# Patient Record
Sex: Female | Born: 1975 | Race: White | Hispanic: No | Smoking: Current every day smoker
Health system: Southern US, Community
[De-identification: ages and names within clinical notes are randomized; demographics above are authoritative.]

## PROBLEM LIST (undated history)

## (undated) DIAGNOSIS — G4700 Insomnia, unspecified: Secondary | ICD-10-CM

## (undated) DIAGNOSIS — R51 Headache: Secondary | ICD-10-CM

## (undated) DIAGNOSIS — I959 Hypotension, unspecified: Secondary | ICD-10-CM

## (undated) DIAGNOSIS — G4733 Obstructive sleep apnea (adult) (pediatric): Secondary | ICD-10-CM

## (undated) DIAGNOSIS — M542 Cervicalgia: Secondary | ICD-10-CM

## (undated) DIAGNOSIS — J45909 Unspecified asthma, uncomplicated: Secondary | ICD-10-CM

## (undated) DIAGNOSIS — R519 Headache, unspecified: Secondary | ICD-10-CM

## (undated) DIAGNOSIS — N941 Unspecified dyspareunia: Secondary | ICD-10-CM

## (undated) DIAGNOSIS — G43909 Migraine, unspecified, not intractable, without status migrainosus: Secondary | ICD-10-CM

## (undated) DIAGNOSIS — G8929 Other chronic pain: Secondary | ICD-10-CM

## (undated) HISTORY — PX: APPENDECTOMY: SHX54

## (undated) HISTORY — PX: KIDNEY SURGERY: SHX687

---

## 2010-02-02 ENCOUNTER — Encounter: Payer: Self-pay | Admitting: Internal Medicine

## 2010-02-12 ENCOUNTER — Encounter: Payer: Self-pay | Admitting: Internal Medicine

## 2010-03-14 ENCOUNTER — Encounter: Payer: Self-pay | Admitting: Internal Medicine

## 2012-02-03 ENCOUNTER — Ambulatory Visit: Payer: Self-pay | Admitting: Internal Medicine

## 2012-06-28 ENCOUNTER — Ambulatory Visit: Payer: Self-pay | Admitting: Internal Medicine

## 2012-06-28 LAB — URINALYSIS, COMPLETE
Bilirubin,UR: NEGATIVE
Glucose,UR: NEGATIVE mg/dL (ref 0–75)
Leukocyte Esterase: NEGATIVE
Nitrite: NEGATIVE
RBC,UR: NONE SEEN /HPF (ref 0–5)
Specific Gravity: 1.01 (ref 1.003–1.030)
Squamous Epithelial: >30

## 2012-06-30 LAB — URINE CULTURE

## 2012-11-18 ENCOUNTER — Ambulatory Visit: Payer: Self-pay | Admitting: Emergency Medicine

## 2013-01-03 IMAGING — CR DG THORACIC SPINE 2-3V
1 series · 2 of 2 positions shown · non-contrast
Comparison: none

REASON FOR EXAM: mid back pain
COMMENTS:

PROCEDURE:     KDR - KDXR THORACIC AP AND LATERAL  - February 03, 2012  [DATE]
RESULT:     Thoracic spine AP and lateral views demonstrate normal alignment
with preservation of the vertebral body heights. There is no bony
destruction or focal sclerotic mass.

[Series 1: ap · 0.17mm/px · 2 of 2 slices shown]
[im 1/2]
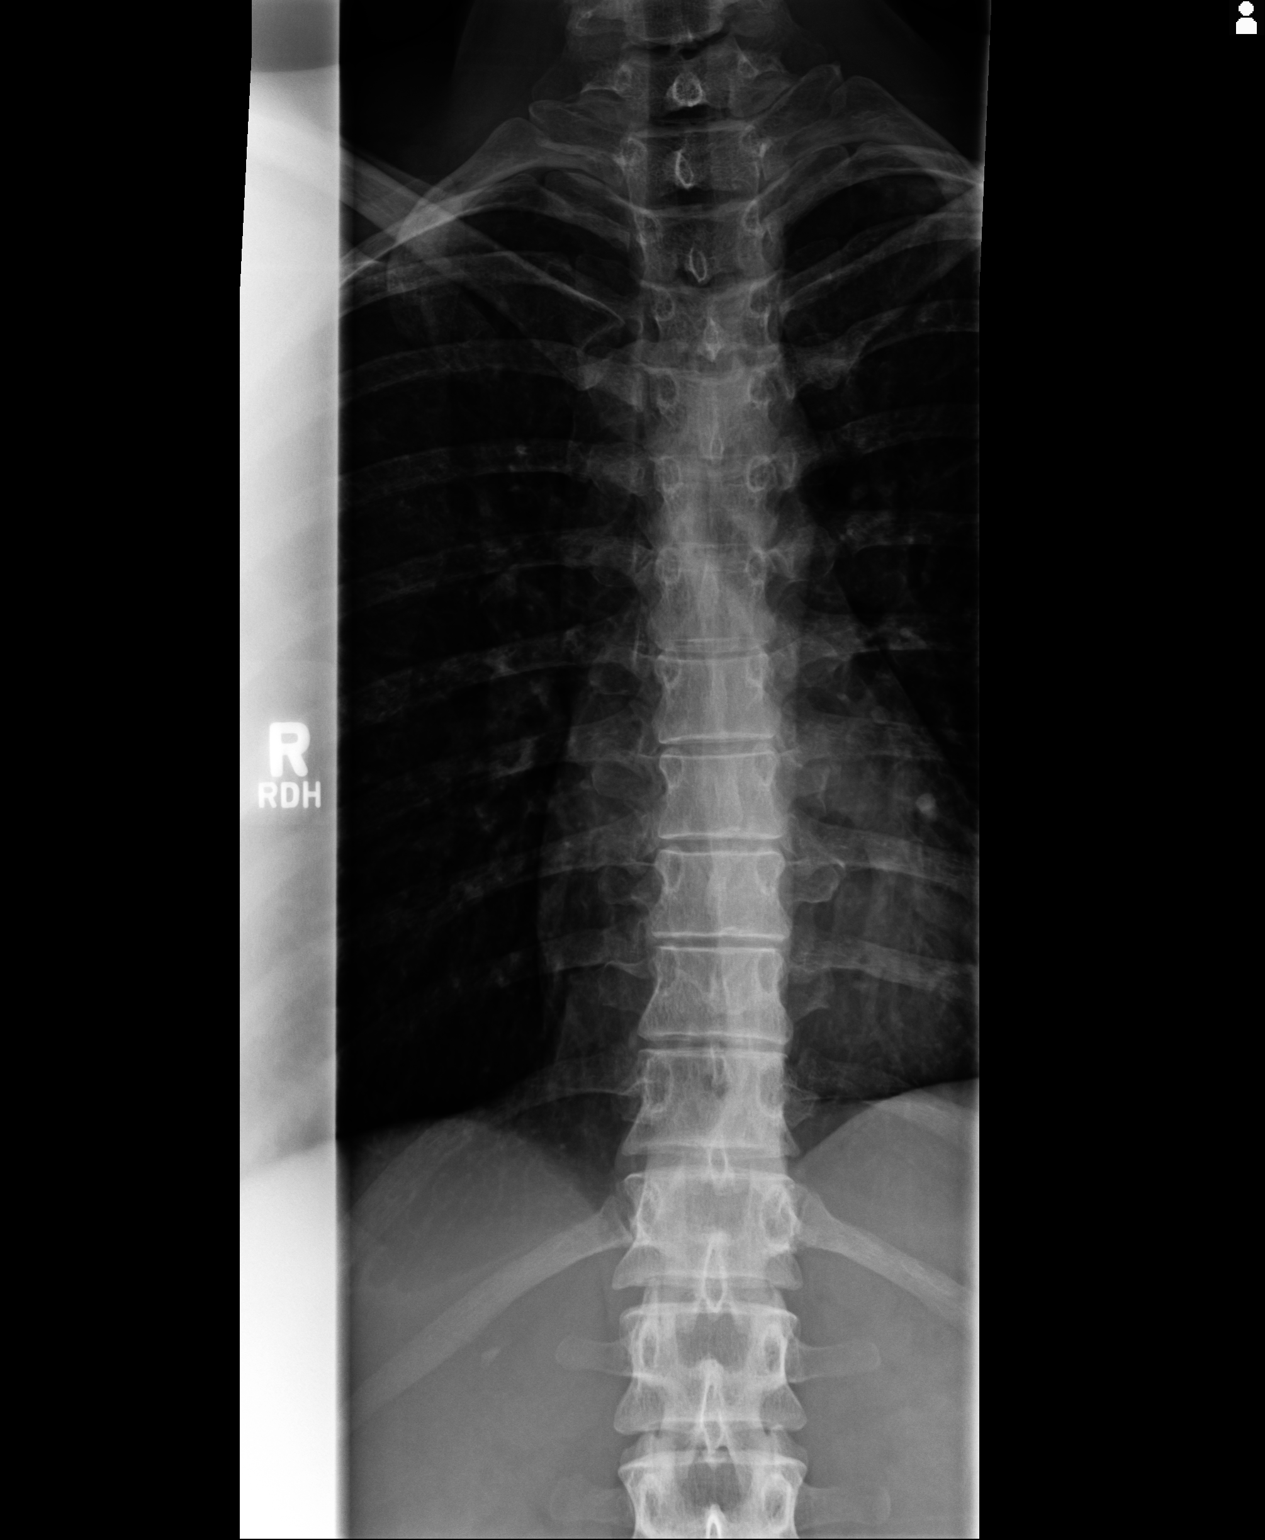
[im 2/2]
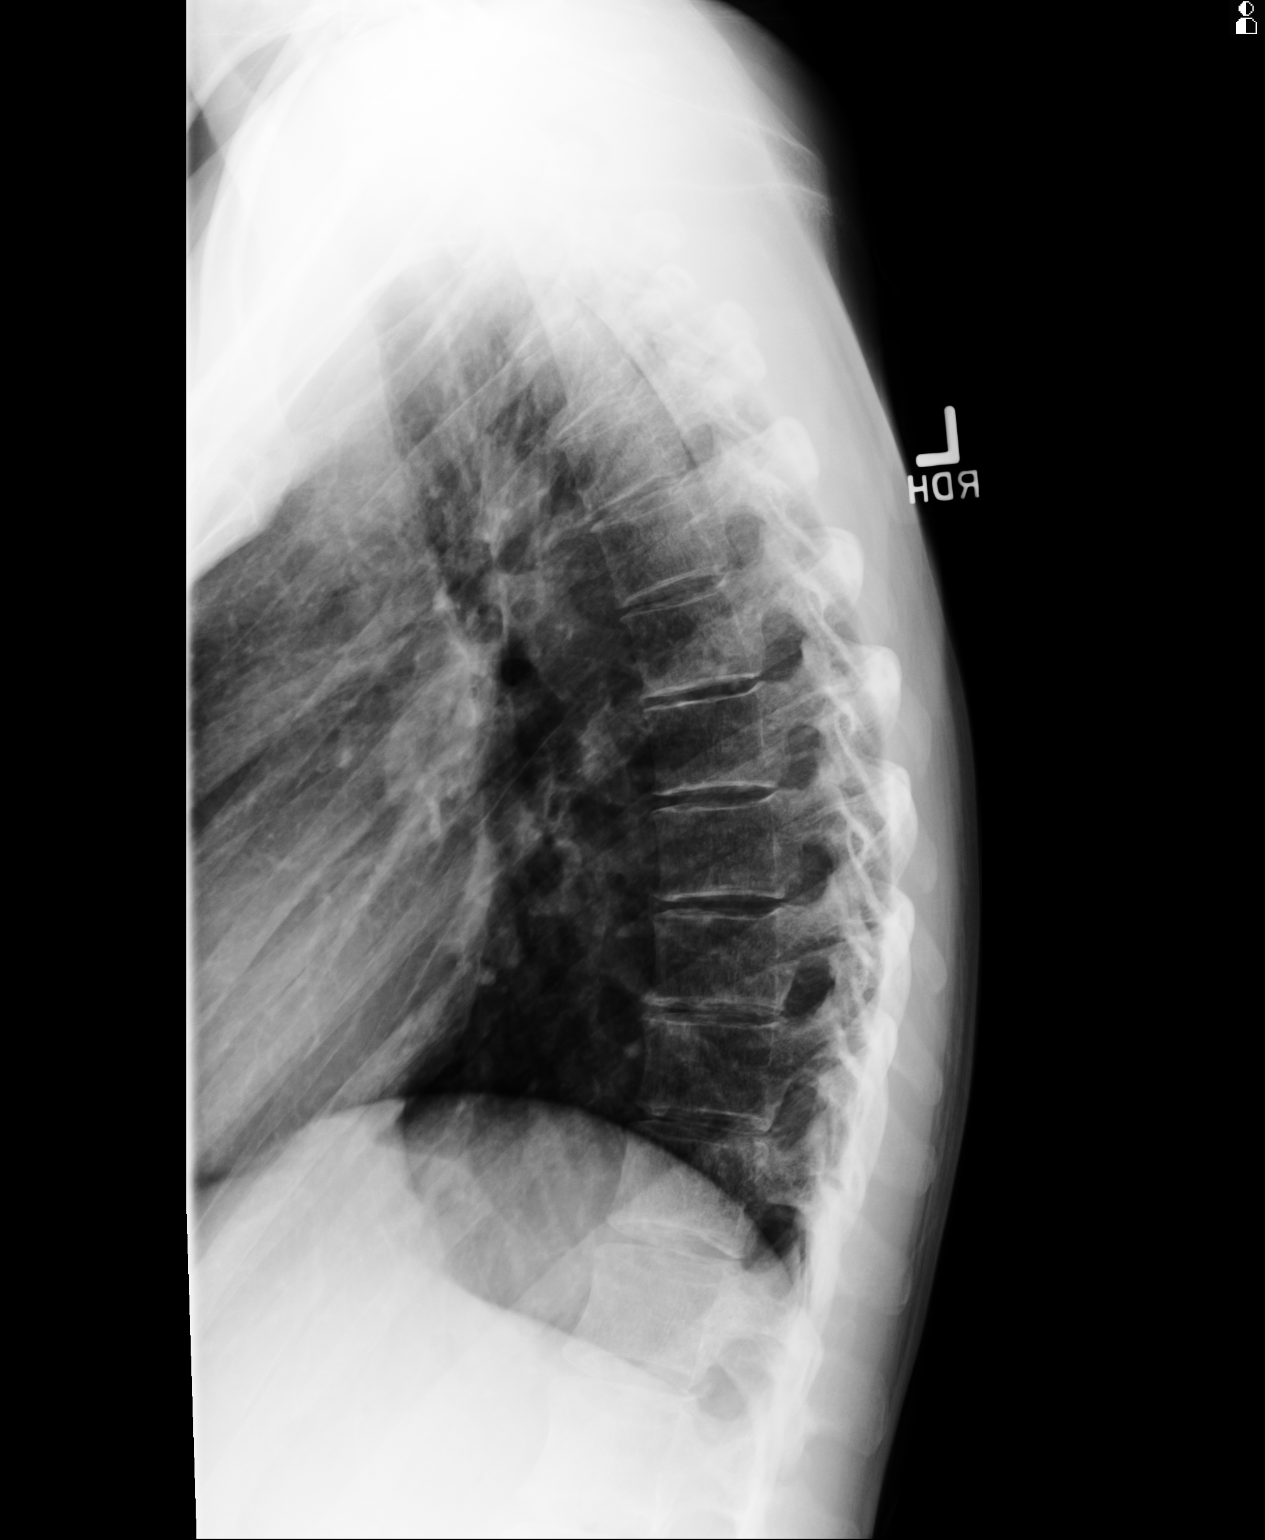

[2 of 2 positions shown; findings below may reference images not displayed]

IMPRESSION: No acute thoracic spine bony abnormality.

## 2013-04-30 LAB — CBC
HCT: 36.6 % (ref 35.0–47.0)
MCHC: 34.5 g/dL (ref 32.0–36.0)
MCV: 87 fL (ref 80–100)
RBC: 4.22 10*6/uL (ref 3.80–5.20)
RDW: 12.6 % (ref 11.5–14.5)
WBC: 9.7 10*3/uL (ref 3.6–11.0)

## 2013-04-30 LAB — URINALYSIS, COMPLETE
Bilirubin,UR: NEGATIVE
Glucose,UR: NEGATIVE mg/dL (ref 0–75)
Hyaline Cast: 2
Ph: 5 (ref 4.5–8.0)
Protein: 30
RBC,UR: 2 /HPF (ref 0–5)
Squamous Epithelial: 5

## 2013-04-30 LAB — COMPREHENSIVE METABOLIC PANEL
Albumin: 4 g/dL (ref 3.4–5.0)
Alkaline Phosphatase: 56 U/L (ref 50–136)
Anion Gap: 9 (ref 7–16)
Bilirubin,Total: 0.8 mg/dL (ref 0.2–1.0)
Calcium, Total: 9.3 mg/dL (ref 8.5–10.1)
Chloride: 104 mmol/L (ref 98–107)
Co2: 23 mmol/L (ref 21–32)
Creatinine: 0.78 mg/dL (ref 0.60–1.30)
EGFR (Non-African Amer.): >60
Glucose: 98 mg/dL (ref 65–99)
Osmolality: 272 (ref 275–301)
Potassium: 3.4 mmol/L — ABNORMAL LOW (ref 3.5–5.1)
SGOT(AST): 9 U/L — ABNORMAL LOW (ref 15–37)
SGPT (ALT): 15 U/L (ref 12–78)
Sodium: 136 mmol/L (ref 136–145)

## 2013-04-30 LAB — WET PREP, GENITAL

## 2013-05-01 ENCOUNTER — Observation Stay: Payer: Self-pay | Admitting: Surgery

## 2013-05-01 LAB — CBC WITH DIFFERENTIAL/PLATELET
Basophil %: 0.1 %
Eosinophil #: 0 10*3/uL (ref 0.0–0.7)
HCT: 30.8 % — ABNORMAL LOW (ref 35.0–47.0)
Lymphocyte #: 0.5 10*3/uL — ABNORMAL LOW (ref 1.0–3.6)
Lymphocyte %: 5.6 %
MCH: 29.8 pg (ref 26.0–34.0)
MCHC: 34.5 g/dL (ref 32.0–36.0)
MCV: 87 fL (ref 80–100)
Monocyte #: 0.5 x10 3/mm (ref 0.2–0.9)
Monocyte %: 5.7 %
Neutrophil #: 7.7 10*3/uL — ABNORMAL HIGH (ref 1.4–6.5)
Neutrophil %: 88.6 %
RBC: 3.57 10*6/uL — ABNORMAL LOW (ref 3.80–5.20)
WBC: 8.7 10*3/uL (ref 3.6–11.0)

## 2013-05-02 LAB — PATHOLOGY REPORT

## 2015-03-06 NOTE — Op Note (Signed)
PATIENT NAME:  Wanda Bean, Wanda Bean MR#:  [id number] DATE OF BIRTH:  12-31-1975  DATE OF PROCEDURE:  05/01/2013  OPERATION PERFORMED: Laparoscopic appendectomy.   PREOPERATIVE DIAGNOSIS: Acute appendicitis.   POSTOPERATIVE DIAGNOSIS: Acute appendicitis, with perforation and generalized peritonitis.   SURGEON: Claude MangesWilliam F. Sahith Nurse, M.D.   ANESTHESIA: General.   PROCEDURE IN DETAIL: The patient was placed supine on the operating room table and prepped and draped in the usual sterile fashion. Bean 1 inch incision was made in the supraumbilical midline and this was carried down through the linea alba, and Bean Hasson cannula was introduced amidst horizontal mattress sutures of 0 Vicryl. Bean 15 mmHg CO2 pneumoperitoneum was created, and 2 additional 5 mm trocars were placed under direct visualization. The patient was noted to have pus in all 4 quadrants of the abdomen as well as the pelvis, and there was some exudate on various loops of small intestine near the pelvis and Bean number of these loops were also fairly intensely inflamed and erythematous. The appendix, itself, was retrocecal and was densely scarred in place (consistent with multiple previous episodes of acute appendicitis). The appendectomy was performed by taking the scar down with the Harmonic scalpel and taking the mesoappendix down with the Harmonic scalpel and performing the appendectomy with the Endo GIA stapling device. The appendix was placed in an Endo Catch bag and extracted from the abdomen via the epigastric port. 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 The right lower quadrant and right gutter were irrigated with Bean liter of warm normal saline, and this included irrigation of the pelvis. All of this was suctioned clear, and there was no evidence of pus in these quadrants. Then, another liter of warm normal saline was used to irrigate the right upper quadrant in the  subhepatic space and the subphrenic space, as well as the left upper quadrant in the subphrenic space and the left gutter. All of these areas were suctioned clear, and at the conclusion of this suctioning, there was no remaining pus present within the peritoneal cavity. The omentum was dragged over top of the inflamed intestine down into the pelvis and overlying the area of the dissection (which essentially was retroperitoneal once the small bowel was flipped back over into the right lower quadrant), and the peritoneum was desufflated and decannulated. The linea alba was closed with Bean running 0 PDS suture, and the previously placed Vicryls were tied one to another. All 3 skin sites were closed with subcuticular 5-0 Monocryl and suture strips. The patient tolerated the procedure well. There were no complications.   ____________________________ Claude MangesWilliam F. Wanda Feria, MD wfm:gb D: 05/01/2013 03:45:27 ET T: 05/01/2013 04:42:50 ET JOB#: [id number]  cc: Claude MangesWilliam F. Crystallee Werden, MD, <Dictator> Claude MangesWILLIAM F Tabathia Knoche MD ELECTRONICALLY SIGNED 05/01/2013 20:33

## 2015-03-06 NOTE — H&P (Signed)
History of Present Illness 32 yof with mild mid and generalized abdominal pain since Saturday (3 days), much worse for about 18 hours, associated with nausea and vomiting. No fever.   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   Place of Living Home   Review of Systems:  Fever/Chills No   Cough No   Sputum No   Abdominal Pain Yes   Diarrhea No   Constipation No   Nausea/Vomiting Yes   SOB/DOE No   Chest Pain No   Dysuria No   Tolerating PT Yes   Tolerating Diet No  Nauseated  Vomiting   Medications/Allergies Reviewed Medications/Allergies reviewed   Physical Exam:  GEN well developed, no acute distress, thin   HEENT pink conjunctivae, PERRL, hearing intact to voice, moist oral mucosa, Oropharynx clear   NECK supple  No masses  thyroid not tender   RESP normal resp effort  clear BS  no use of accessory muscles   CARD regular rate  no murmur  No LE edema  no JVD  no Rub   ABD positive tenderness  normal BS  RLQ tenderness - mild rebound, no guarding   LYMPH negative neck   EXTR negative cyanosis/clubbing, negative edema   SKIN normal to palpation, skin turgor good   NEURO cranial nerves intact, negative tremor, follows commands, motor/sensory function intact   PSYCH alert, A+O to time, place, person   Lab Results: Hepatic:  17-Jun-14 20:09   Bilirubin, Total 0.8  Alkaline Phosphatase 56  SGPT (ALT) 15  SGOT (AST)  9  Total Protein, Serum 7.5  Albumin, Serum 4.0  Routine Micro:  17-Jun-14 21:55   Micro Text Report WET PREP  COMMENT                   FEW WHITE BLOOD CELLS SEEN   COMMENT                   FEW CLUE CELLS SEEN   COMMENT                   NO YEAST SEEN   COMMENT                   NO TRICHOMONAS SEEN   COMMENT                   NO SPERMATOZOA SEEN   ANTIBIOTIC                       Micro Text Report CHLAM/N.GC RT-PCR ([facility])   CHLAMYDIA                 CHLAMYDIA TRACHOMATIS NEGATIVE   N.GONORRHOEAE             N.GONORRHOEAE NEGATIVE   ANTIBIOTIC                       Comment 1. FEW WHITE BLOOD CELLS SEEN  Comment 2. FEW CLUE CELLS SEEN  Comment 3. NO YEAST SEEN  Comment  4. NO TRICHOMONAS SEEN  Comment 5. NO SPERMATOZOA SEEN  Result(s) reported on 30 Apr 2013 at 10:10PM.  Routine Chem:  17-Jun-14 20:09   Glucose, Serum 98  BUN 12  Creatinine (comp) 0.78  Sodium, Serum 136  Potassium, Serum  3.4  Chloride, Serum 104  CO2, Serum 23  Calcium (Total), Serum 9.3  Osmolality (calc) 272  eGFR (African American) >60  eGFR (Non-African American) >60 (eGFR values <39mL/min/1.73 m2 may be an indication of chronic kidney disease (CKD). Calculated eGFR is useful in patients with stable renal function. The eGFR calculation will not be reliable in acutely ill patients when serum creatinine is changing rapidly. It is not useful in  patients on dialysis. The eGFR calculation may not be applicable to patients at the low and high extremes of body sizes, pregnant women, and vegetarians.)  Anion Gap 9  Lipase 102 (Result(s) reported on 30 Apr 2013 at 08:43PM.)  Routine UA:  17-Jun-14 21:30   Color (UA) Yellow  Clarity (UA) Hazy  Glucose (UA) Negative  Bilirubin (UA) Negative  Ketones (UA) 2+  Specific Gravity (UA) 1.025  Blood (UA) 2+  pH (UA) 5.0  Protein (UA) 30 mg/dL  Nitrite (UA) Negative  Leukocyte Esterase (UA) Trace (Result(s) reported on 30 Apr 2013 at 09:51PM.)  RBC (UA) 2 /HPF  WBC (UA) 3 /HPF  Bacteria (UA) TRACE  Epithelial Cells (UA) 5 /HPF  Mucous (UA) PRESENT  Hyaline Cast  (UA) 2 /LPF (Result(s) reported on 30 Apr 2013 at 09:51PM.)  Routine Sero:  17-Jun-14 21:30   Pregnancy Test, Urine NEGATIVE (The results of the qualitative urine HCG (Pregnancy Test) should be evaluated in light of other clinical information.  There are limitations to the test which, in certain clinical situations, may result in a false positive or negative result. Thehigh dose hook effect can occur in urine samples with extremely high HCG concentrations.  This effect can produce a negative result in certain situations. It is suggested that results of the qualitative HCG be confirmed by an alternate methodology, such as the quantitative serum beta HCG test.)  Routine Hem:  17-Jun-14 20:09   WBC (CBC) 9.7  RBC (CBC) 4.22  Hemoglobin (CBC) 12.7  Hematocrit (CBC) 36.6  Platelet Count (CBC) 228 (Result(s) reported on 30 Apr 2013 at 08:20PM.)  MCV 87  MCH 30.0  MCHC 34.5  RDW 12.6    Assessment/Admission Diagnosis CT - acute appendicitis   Plan lap appy   Electronic Signatures: Consuela Mimes (MD)  (Signed 18-Jun-14 01:13)  Authored: CHIEF COMPLAINT and HISTORY, PAST MEDICAL/SURGIAL HISTORY, ALLERGIES, HOME MEDICATIONS, FAMILY AND SOCIAL HISTORY, REVIEW OF SYSTEMS, PHYSICAL EXAM, LABS, ASSESSMENT AND PLAN   Last Updated: 18-Jun-14 01:13 by Consuela Mimes (MD)

## 2017-06-01 ENCOUNTER — Encounter: Payer: Self-pay | Admitting: Gynecology

## 2017-06-01 ENCOUNTER — Ambulatory Visit
Admission: EM | Admit: 2017-06-01 | Discharge: 2017-06-01 | Disposition: A | Discharge status: Home / Self Care | Payer: Self-pay | Attending: Family Medicine | Admitting: Family Medicine

## 2017-06-01 DIAGNOSIS — R319 Hematuria, unspecified: Secondary | ICD-10-CM

## 2017-06-01 DIAGNOSIS — N390 Urinary tract infection, site not specified: Secondary | ICD-10-CM

## 2017-06-01 HISTORY — DX: Cervicalgia: M54.2

## 2017-06-01 HISTORY — DX: Hypotension, unspecified: I95.9

## 2017-06-01 HISTORY — DX: Other chronic pain: G89.29

## 2017-06-01 HISTORY — DX: Obstructive sleep apnea (adult) (pediatric): G47.33

## 2017-06-01 HISTORY — DX: Unspecified asthma, uncomplicated: J45.909

## 2017-06-01 HISTORY — DX: Headache, unspecified: R51.9

## 2017-06-01 HISTORY — DX: Unspecified dyspareunia: N94.10

## 2017-06-01 HISTORY — DX: Headache: R51

## 2017-06-01 HISTORY — DX: Migraine, unspecified, not intractable, without status migrainosus: G43.909

## 2017-06-01 HISTORY — DX: Insomnia, unspecified: G47.00

## 2017-06-01 LAB — URINALYSIS, COMPLETE (UACMP) WITH MICROSCOPIC

## 2017-06-01 MED ORDER — CEPHALEXIN 500 MG PO CAPS: 500.0000 mg | ORAL_CAPSULE | Freq: Four times a day (QID) | ORAL | 0 refills | Status: DC

## 2017-06-01 MED ORDER — PHENAZOPYRIDINE HCL 200 MG PO TABS
200.0000 mg | ORAL_TABLET | Freq: Three times a day (TID) | ORAL | 0 refills | Status: DC
Start: 2017-06-01 — End: 2017-06-30

## 2017-06-01 NOTE — ED Provider Notes (Signed)
CSN: [id number]     Arrival date & time 06/01/17  1614 History   First MD Initiated Contact with Patient 06/01/17 1731     No chief complaint on file.  (Consider location/radiation/quality/duration/timing/severity/associated sxs/prior Treatment) HPI This a 41 year old female who presents with dysuria with frequency and urgency for 3 days. He denies any fever or chills. She has no unusual vaginal discharge. 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 She was warned by that physician that she would have a future of urinary tract infections. States that during the summertime she doesn't drink enough fluids which tends to have her more prone to having the UTIs. He works as a Chemical engineer.       Past Medical History:  Diagnosis Date  . Asthma   . Chronic neck pain   . Dyspareunia in female   . Head ache   . Hypotension   . Insomnia   . Migraines   . Obstructive sleep apnea    Past Surgical History:  Procedure Laterality Date  . APPENDECTOMY    . KIDNEY SURGERY     Family History  Problem Relation Age of Onset  . Aneurysm Mother    Social History  Substance Use Topics  . Smoking status: Current Every Day Smoker    Packs/day: 1.00  . Smokeless tobacco: Never Used  . Alcohol use Yes   OB History    No data available     Review of Systems  Constitutional: Positive for activity change. Negative for appetite change, chills, fatigue and fever.  Genitourinary: Positive for dysuria, frequency and urgency. Negative for vaginal discharge.  All other systems reviewed and are negative.   Allergies  Penicillins  Home Medications   Prior to Admission medications   Medication Sig Start Date End Date Taking? Authorizing Provider  albuterol (PROVENTIL HFA;VENTOLIN HFA) 108 (90 Base) MCG/ACT inhaler Inhale into the lungs every 6 (six) hours as needed for wheezing or  shortness of breath.   Yes [provider]  levonorgestrel (MIRENA) 20 MCG/24HR IUD 1 each by Intrauterine route once.   Yes [provider]  sertraline (ZOLOFT) 100 MG tablet Take 100 mg by mouth daily.   Yes [provider]  traZODone (DESYREL) 50 MG tablet Take 50 mg by mouth at bedtime.   Yes [provider]  cephALEXin (KEFLEX) 500 MG capsule Take 1 capsule (500 mg total) by mouth 4 (four) times daily. 06/01/17   Lutricia Feil, PA-C  phenazopyridine (PYRIDIUM) 200 MG tablet Take 1 tablet (200 mg total) by mouth 3 (three) times daily. 06/01/17   Lutricia Feil, PA-C   Meds Ordered and Administered this Visit  Medications - No data to display  BP (!) 105/46 (BP Location: Left Arm)   Pulse 77   Temp 98.2 F (36.8 C) (Oral)   Resp 16   Ht 5\' 7"  (1.702 m)   Wt 112 lb (50.8 kg)   LMP 06/01/2017 Comment: IUD  SpO2 100%   BMI 17.54 kg/m  No data found.   Physical Exam  Constitutional: She is oriented to person, place, and time. She appears well-developed and well-nourished. No distress.  HENT:  Head: Normocephalic.  Eyes: Pupils are equal, round, and reactive to light.  Neck: Normal range of motion.  Pulmonary/Chest: Effort normal and breath sounds normal.  Abdominal: Soft. Bowel sounds are normal. She exhibits no distension. There  is no tenderness. There is no rebound and no guarding.  Patient has no CVA tenderness  Musculoskeletal: Normal range of motion.  Neurological: She is alert and oriented to person, place, and time.  Skin: Skin is warm and dry. She is not diaphoretic.  Psychiatric: She has a normal mood and affect. Her behavior is normal. Judgment and thought content normal.  Nursing note and vitals reviewed.   Urgent Care Course     Procedures (including critical care time)  Labs Review Labs Reviewed  URINALYSIS, COMPLETE (UACMP) WITH MICROSCOPIC - Abnormal; Notable for the following:       Result Value   Color, Urine  ORANGE (*)    APPearance CLOUDY (*)    Glucose, UA   (*)    Value: TEST NOT REPORTED DUE TO COLOR INTERFERENCE OF URINE PIGMENT   Hgb urine dipstick   (*)    Value: TEST NOT REPORTED DUE TO COLOR INTERFERENCE OF URINE PIGMENT   Bilirubin Urine   (*)    Value: TEST NOT REPORTED DUE TO COLOR INTERFERENCE OF URINE PIGMENT   Ketones, ur   (*)    Value: TEST NOT REPORTED DUE TO COLOR INTERFERENCE OF URINE PIGMENT   Protein, ur   (*)    Value: TEST NOT REPORTED DUE TO COLOR INTERFERENCE OF URINE PIGMENT   Nitrite   (*)    Value: TEST NOT REPORTED DUE TO COLOR INTERFERENCE OF URINE PIGMENT   Leukocytes, UA   (*)    Value: TEST NOT REPORTED DUE TO COLOR INTERFERENCE OF URINE PIGMENT   Squamous Epithelial / LPF 6-30 (*)    Bacteria, UA FEW (*)    All other components within normal limits  URINE CULTURE    Imaging Review No results found.   Visual Acuity Review  Right Eye Distance:   Left Eye Distance:   Bilateral Distance:    Right Eye Near:   Left Eye Near:    Bilateral Near:         MDM   1. Urinary tract infection without hematuria, site unspecified    Discharge Medication List as of 06/01/2017  5:46 PM    START taking these medications   Details  cephALEXin (KEFLEX) 500 MG capsule Take 1 capsule (500 mg total) by mouth 4 (four) times daily., Starting Thu 06/01/2017, Normal    phenazopyridine (PYRIDIUM) 200 MG tablet Take 1 tablet (200 mg total) by mouth 3 (three) times daily., Starting Thu 06/01/2017, Normal      Plan: 1. Test/x-ray results and diagnosis reviewed with patient 2. rx as per orders; risks, benefits, potential side effects reviewed with patient 3. Recommend supportive treatment with Increased fluids. Was switched from AZO to Pyridium. If she is not improving after several days of the antibiotic should follow-up with her primary care physician or GYN as her could be other sources of her dysuria. This was fully explained to the patient. She is allergic to  penicillin but states that she has had Ceftin in the past which did not cause any problems. Urine cultures and sensitivities should be available in 48 hours. Treatment will be guided accordingly  4. F/u prn if symptoms worsen or don't improve     Lutricia FeilRoemer, Jin Capote P, PA-C 06/01/17 1820

## 2017-06-01 NOTE — ED Triage Notes (Signed)
Patient c/o painful and burning urination x 3 days.

## 2017-06-04 LAB — URINE CULTURE

## 2017-06-30 ENCOUNTER — Encounter: Payer: Self-pay | Admitting: Emergency Medicine

## 2017-06-30 ENCOUNTER — Ambulatory Visit
Admission: EM | Admit: 2017-06-30 | Discharge: 2017-06-30 | Disposition: A | Discharge status: Home / Self Care | Payer: Self-pay | Attending: Family Medicine | Admitting: Family Medicine

## 2017-06-30 DIAGNOSIS — J069 Acute upper respiratory infection, unspecified: Secondary | ICD-10-CM

## 2017-06-30 MED ORDER — HYDROCOD POLST-CPM POLST ER 10-8 MG/5ML PO SUER: 5.0000 mL | Freq: Two times a day (BID) | ORAL | 0 refills | Status: AC

## 2017-06-30 MED ORDER — BENZONATATE 200 MG PO CAPS: ORAL_CAPSULE | ORAL | 0 refills | Status: AC

## 2017-06-30 NOTE — ED Triage Notes (Signed)
Patient c/o cough, chest congestion , stuffy nose, and bodyaches that started a week ago.

## 2017-06-30 NOTE — ED Provider Notes (Signed)
[facility]    CSN: [id number] Arrival date & time: 06/30/17  1246     History   Chief Complaint Chief Complaint  Patient presents with  . Cough  . Generalized Body Aches    HPI Wanda Bean is a 41 y.o. female.   HPI   This a 41 year old female who presents with cough and chest congestion along with a stuffy nose that started about a week ago. Started sore throat and is seem to sink into her chest. Date that she's had body aches recently severe fatigue. She's been feeling very warm at home on file with chills. Her fever is 100.1 today. She is a smoker of one pack cigarettes per day. She works as a Research scientist (medical).           Past Medical History:  Diagnosis Date  . Asthma   . Chronic neck pain   . Dyspareunia in female   . Head ache   . Hypotension   . Insomnia   . Migraines   . Obstructive sleep apnea     There are no active problems to display for this patient.   Past Surgical History:  Procedure Laterality Date  . APPENDECTOMY    . KIDNEY SURGERY      OB History    No data available       Home Medications    Prior to Admission medications   Medication Sig Start Date End Date Taking? Authorizing Provider  albuterol (PROVENTIL HFA;VENTOLIN HFA) 108 (90 Base) MCG/ACT inhaler Inhale into the lungs every 6 (six) hours as needed for wheezing or shortness of breath.    [provider]  benzonatate (TESSALON) 200 MG capsule Take one cap TID PRN cough 06/30/17   Lutricia Feil, PA-C  chlorpheniramine-HYDROcodone [facility]) 10-8 MG/5ML SUER Take 5 mLs by mouth 2 (two) times daily. 06/30/17   Lutricia Feil, PA-C  levonorgestrel (MIRENA) 20 MCG/24HR IUD 1 each by Intrauterine route once.    [provider]  sertraline (ZOLOFT) 100 MG tablet Take 100 mg by mouth daily.    [provider]  traZODone (DESYREL) 50 MG tablet Take 50 mg by mouth at bedtime.    [provider]    Family  History Family History  Problem Relation Age of Onset  . Aneurysm Mother     Social History Social History  Substance Use Topics  . Smoking status: Current Every Day Smoker    Packs/day: 1.00  . Smokeless tobacco: Never Used  . Alcohol use Yes     Allergies   Penicillins   Review of Systems Review of Systems  Constitutional: Positive for activity change, appetite change, chills, fatigue and fever.  HENT: Positive for congestion, postnasal drip, sinus pain, sinus pressure and sore throat.   Respiratory: Positive for cough. Negative for wheezing and stridor.   All other systems reviewed and are negative.    Physical Exam Triage Vital Signs ED Triage Vitals  Enc Vitals Group     BP 06/30/17 1310 103/60     Pulse Rate 06/30/17 1310 94     Resp 06/30/17 1310 14     Temp 06/30/17 1310 100.1 F (37.8 C)     Temp Source 06/30/17 1310 Oral     SpO2 06/30/17 1310 100 %     Weight 06/30/17 1308 120 lb (54.4 kg)     Height 06/30/17 1308 5' 7.5" (1.715 m)     Head Circumference --  Peak Flow --      Pain Score 06/30/17 1308 8     Pain Loc --      Pain Edu? --      Excl. in GC? --    No data found.   Updated Vital Signs BP 103/60 (BP Location: Left Arm)   Pulse 94   Temp 100.1 F (37.8 C) (Oral)   Resp 14   Ht 5' 7.5" (1.715 m)   Wt 120 lb (54.4 kg)   LMP 06/23/2017 (Approximate) Comment: IUD  SpO2 100%   BMI 18.52 kg/m   Visual Acuity Right Eye Distance:   Left Eye Distance:   Bilateral Distance:    Right Eye Near:   Left Eye Near:    Bilateral Near:     Physical Exam  Constitutional: She is oriented to person, place, and time. She appears well-developed and well-nourished. No distress.  HENT:  Head: Normocephalic.  Right Ear: External ear normal.  Left Ear: External ear normal.  Nose: Nose normal.  Mouth/Throat: Oropharynx is clear and moist. No oropharyngeal exudate.  Eyes: Pupils are equal, round, and reactive to light. Right eye exhibits no  discharge. Left eye exhibits no discharge.  Neck: Normal range of motion.  Pulmonary/Chest: Effort normal and breath sounds normal.  Musculoskeletal: Normal range of motion.  Lymphadenopathy:    She has no cervical adenopathy.  Neurological: She is alert and oriented to person, place, and time.  Skin: Skin is warm and dry. She is not diaphoretic.  Psychiatric: She has a normal mood and affect. Her behavior is normal. Judgment and thought content normal.  Nursing note and vitals reviewed.    UC Treatments / Results  Labs (all labs ordered are listed, but only abnormal results are displayed) Labs Reviewed - No data to display  EKG  EKG Interpretation None       Radiology No results found.  Procedures Procedures (including critical care time)  Medications Ordered in UC Medications - No data to display   Initial Impression / Assessment and Plan / UC Course  I have reviewed the triage vital signs and the nursing notes.  Pertinent labs & imaging results that were available during my care of the patient were reviewed by me and considered in my medical decision making (see chart for details).     Plan: 1. Test/x-ray results and diagnosis reviewed with patient 2. rx as per orders; risks, benefits, potential side effects reviewed with patient 3. Recommend supportive treatment with rest and fluids and increase. Recommend the continued use of Flonase for drainage. We will provide her with Jerilynn Som for daytime cough control and Tussionex for nighttime rest. We'll keep her off work today and tomorrow to return to work on Monday. 4. F/u prn if symptoms worsen or don't improve   Final Clinical Impressions(s) / UC Diagnoses   Final diagnoses:  Upper respiratory tract infection, unspecified type    New Prescriptions Discharge Medication List as of 06/30/2017  2:09 PM       Controlled Substance Prescriptions [place] Controlled Substance Registry consulted? Not Applicable    Lutricia Feil, PA-C 06/30/17 1426
